# Patient Record
Sex: Male | Born: 1950 | Race: White | Hispanic: No | Smoking: Current every day smoker
Health system: Southern US, Community
[De-identification: ages and names within clinical notes are randomized; demographics above are authoritative.]

## PROBLEM LIST (undated history)

## (undated) DIAGNOSIS — I10 Essential (primary) hypertension: Secondary | ICD-10-CM

## (undated) DIAGNOSIS — I70219 Atherosclerosis of native arteries of extremities with intermittent claudication, unspecified extremity: Secondary | ICD-10-CM

## (undated) DIAGNOSIS — N289 Disorder of kidney and ureter, unspecified: Secondary | ICD-10-CM

## (undated) DIAGNOSIS — I2510 Atherosclerotic heart disease of native coronary artery without angina pectoris: Secondary | ICD-10-CM

## (undated) HISTORY — DX: Essential (primary) hypertension: I10

## (undated) HISTORY — DX: Atherosclerotic heart disease of native coronary artery without angina pectoris: I25.10

## (undated) HISTORY — DX: Atherosclerosis of native arteries of extremities with intermittent claudication, unspecified extremity: I70.219

## (undated) HISTORY — DX: Disorder of kidney and ureter, unspecified: N28.9

---

## 2003-11-26 ENCOUNTER — Inpatient Hospital Stay (HOSPITAL_COMMUNITY): Admission: EM | Admit: 2003-11-26 | Discharge: 2003-11-29 | Payer: Self-pay | Admitting: Cardiology

## 2005-04-08 IMAGING — CR DG CHEST 2V
2 series · 2 of 2 positions shown · non-contrast
Comparison: none

CLINICAL DATA: Acute MI.
 TWO VIEW CHEST ? 11/28/03
 The heart is upper limits of normal in size.  The mediastinal contours are within normal limits.  There is mild peribronchial thickening compatible with bronchitic changes. No focal opacities or effusions. 
 IMPRESSION
 Bronchitic changes.

[view not recorded (1 of 2)]
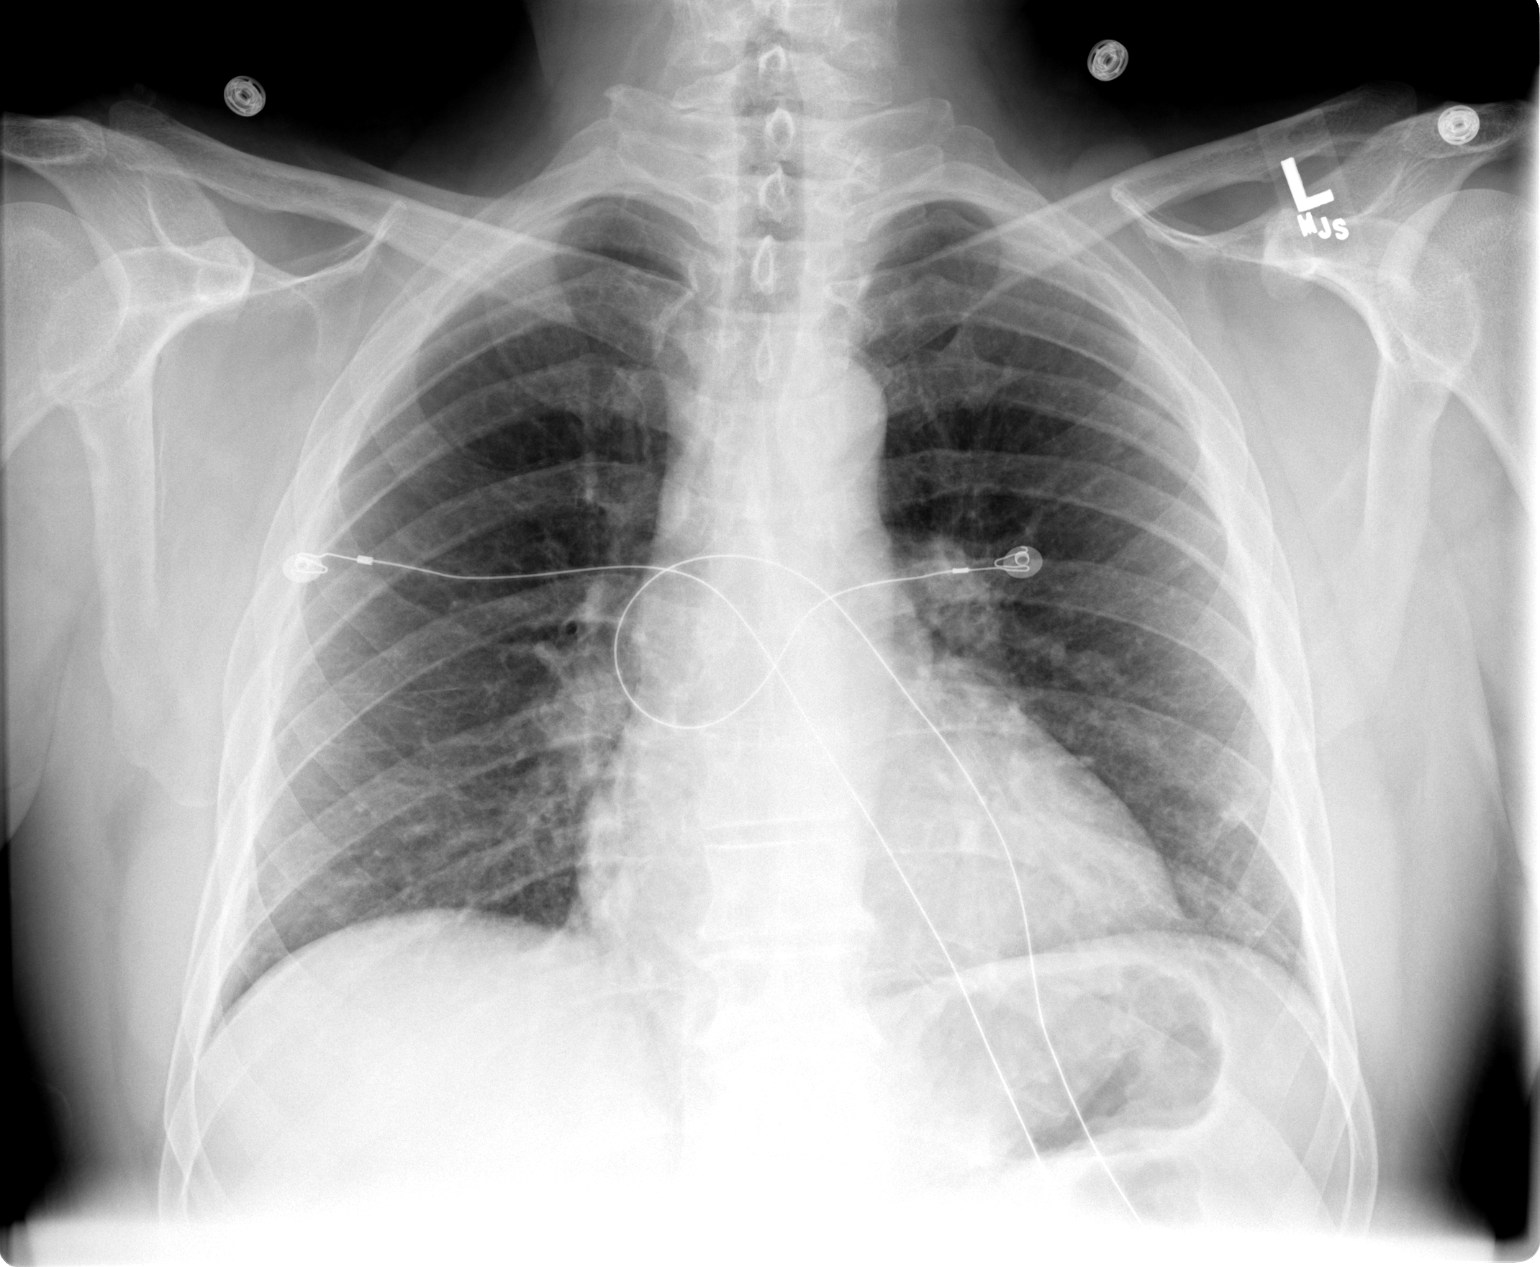

[view not recorded (2 of 2)]
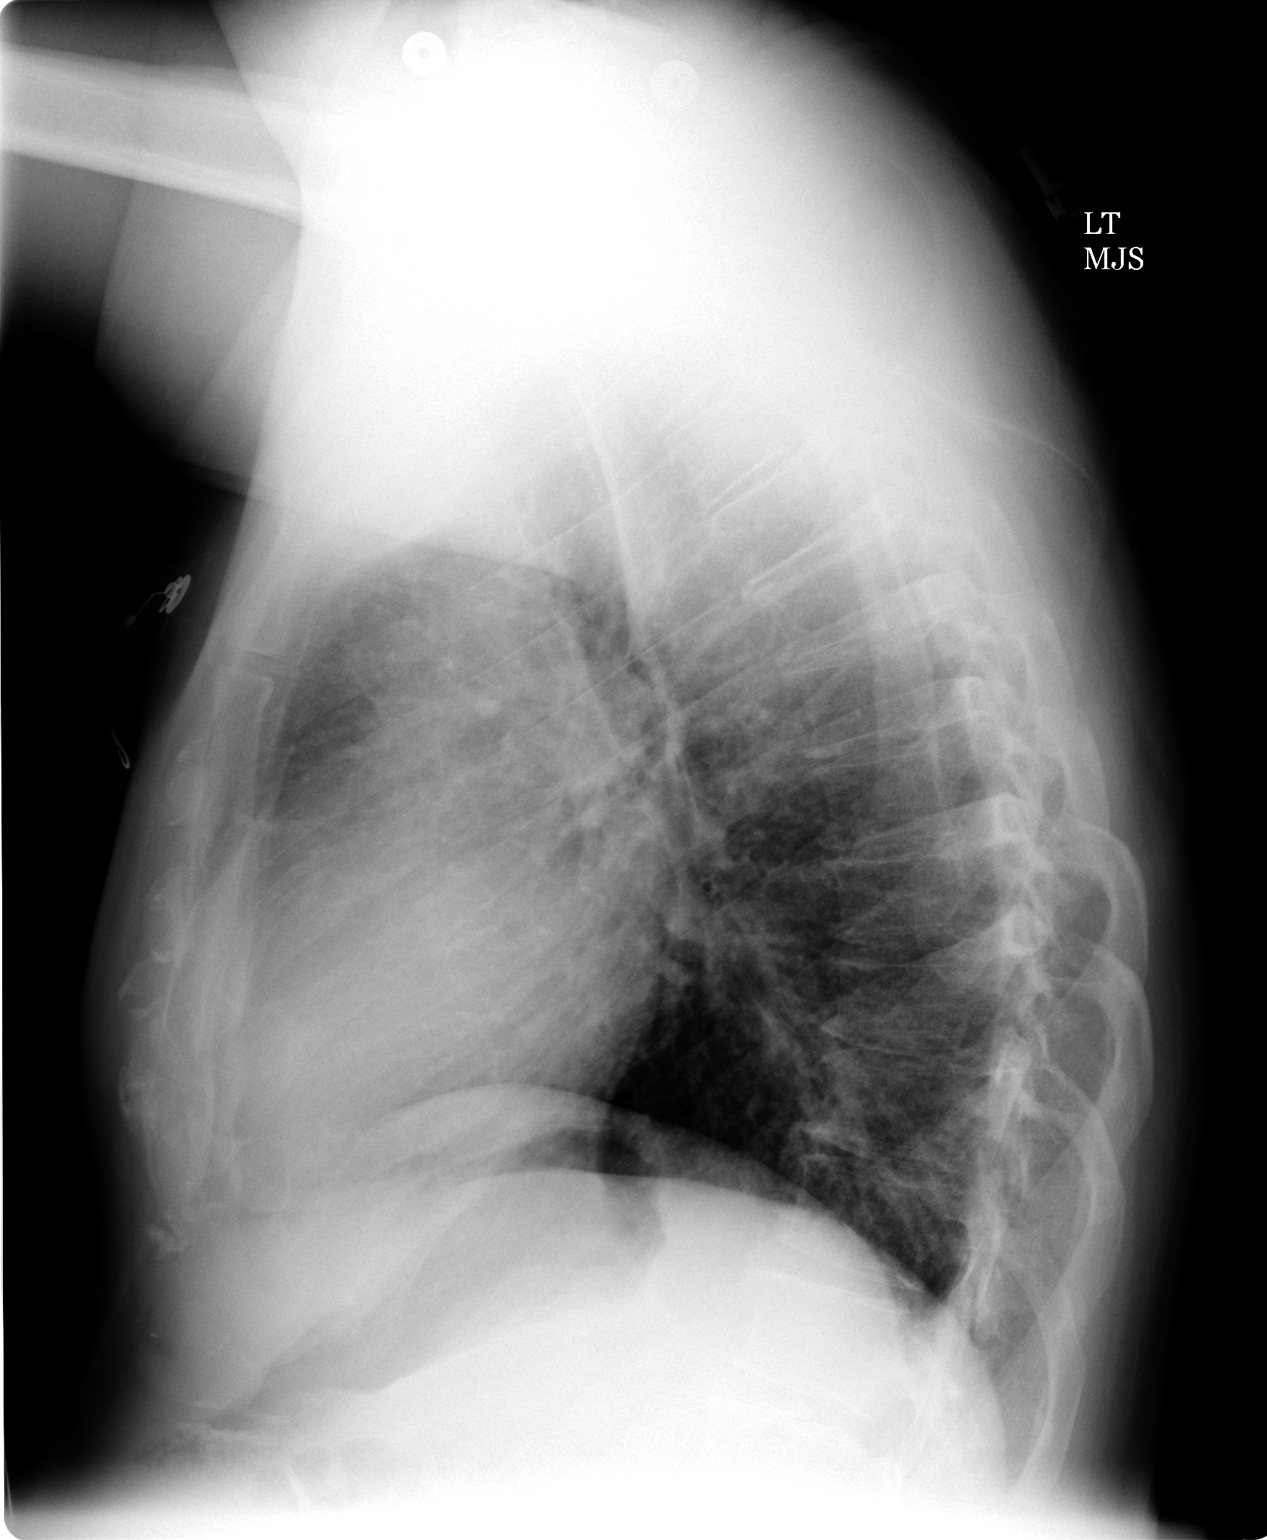

[2 of 2 positions shown; findings below may reference images not displayed]

## 2012-05-18 ENCOUNTER — Other Ambulatory Visit: Payer: Self-pay

## 2012-05-18 DIAGNOSIS — I70219 Atherosclerosis of native arteries of extremities with intermittent claudication, unspecified extremity: Secondary | ICD-10-CM

## 2012-06-20 ENCOUNTER — Encounter: Payer: Self-pay | Admitting: Vascular Surgery

## 2015-10-23 DIAGNOSIS — I739 Peripheral vascular disease, unspecified: Secondary | ICD-10-CM | POA: Diagnosis not present

## 2015-10-23 DIAGNOSIS — I10 Essential (primary) hypertension: Secondary | ICD-10-CM | POA: Diagnosis not present

## 2015-10-23 DIAGNOSIS — I2510 Atherosclerotic heart disease of native coronary artery without angina pectoris: Secondary | ICD-10-CM | POA: Diagnosis not present

## 2015-10-23 DIAGNOSIS — E6609 Other obesity due to excess calories: Secondary | ICD-10-CM | POA: Diagnosis not present

## 2015-10-23 DIAGNOSIS — F17200 Nicotine dependence, unspecified, uncomplicated: Secondary | ICD-10-CM | POA: Diagnosis not present

## 2015-10-23 DIAGNOSIS — E119 Type 2 diabetes mellitus without complications: Secondary | ICD-10-CM | POA: Diagnosis not present

## 2016-01-20 DIAGNOSIS — E782 Mixed hyperlipidemia: Secondary | ICD-10-CM | POA: Diagnosis not present

## 2016-01-20 DIAGNOSIS — F17200 Nicotine dependence, unspecified, uncomplicated: Secondary | ICD-10-CM | POA: Diagnosis not present

## 2016-01-20 DIAGNOSIS — I10 Essential (primary) hypertension: Secondary | ICD-10-CM | POA: Diagnosis not present

## 2016-01-20 DIAGNOSIS — E119 Type 2 diabetes mellitus without complications: Secondary | ICD-10-CM | POA: Diagnosis not present

## 2016-01-20 DIAGNOSIS — N183 Chronic kidney disease, stage 3 (moderate): Secondary | ICD-10-CM | POA: Diagnosis not present

## 2016-03-23 DIAGNOSIS — H25013 Cortical age-related cataract, bilateral: Secondary | ICD-10-CM | POA: Diagnosis not present

## 2016-03-23 DIAGNOSIS — E119 Type 2 diabetes mellitus without complications: Secondary | ICD-10-CM | POA: Diagnosis not present

## 2016-04-21 DIAGNOSIS — I10 Essential (primary) hypertension: Secondary | ICD-10-CM | POA: Diagnosis not present

## 2016-04-21 DIAGNOSIS — F17200 Nicotine dependence, unspecified, uncomplicated: Secondary | ICD-10-CM | POA: Diagnosis not present

## 2016-04-21 DIAGNOSIS — I70213 Atherosclerosis of native arteries of extremities with intermittent claudication, bilateral legs: Secondary | ICD-10-CM | POA: Diagnosis not present

## 2016-04-21 DIAGNOSIS — E119 Type 2 diabetes mellitus without complications: Secondary | ICD-10-CM | POA: Diagnosis not present

## 2016-10-20 DIAGNOSIS — E119 Type 2 diabetes mellitus without complications: Secondary | ICD-10-CM | POA: Diagnosis not present

## 2016-10-20 DIAGNOSIS — N183 Chronic kidney disease, stage 3 (moderate): Secondary | ICD-10-CM | POA: Diagnosis not present

## 2016-10-20 DIAGNOSIS — F17200 Nicotine dependence, unspecified, uncomplicated: Secondary | ICD-10-CM | POA: Diagnosis not present

## 2016-10-20 DIAGNOSIS — I70213 Atherosclerosis of native arteries of extremities with intermittent claudication, bilateral legs: Secondary | ICD-10-CM | POA: Diagnosis not present

## 2016-10-20 DIAGNOSIS — I10 Essential (primary) hypertension: Secondary | ICD-10-CM | POA: Diagnosis not present

## 2017-05-09 DIAGNOSIS — E119 Type 2 diabetes mellitus without complications: Secondary | ICD-10-CM | POA: Diagnosis not present

## 2017-05-09 DIAGNOSIS — I70213 Atherosclerosis of native arteries of extremities with intermittent claudication, bilateral legs: Secondary | ICD-10-CM | POA: Diagnosis not present

## 2017-05-09 DIAGNOSIS — Z720 Tobacco use: Secondary | ICD-10-CM | POA: Diagnosis not present

## 2017-05-09 DIAGNOSIS — I10 Essential (primary) hypertension: Secondary | ICD-10-CM | POA: Diagnosis not present

## 2017-05-09 DIAGNOSIS — N183 Chronic kidney disease, stage 3 (moderate): Secondary | ICD-10-CM | POA: Diagnosis not present

## 2022-07-17 ENCOUNTER — Encounter (HOSPITAL_COMMUNITY): Payer: Self-pay | Admitting: Pharmacy Technician

## 2022-07-17 ENCOUNTER — Emergency Department (HOSPITAL_COMMUNITY): Payer: Medicare Other

## 2022-07-17 ENCOUNTER — Observation Stay (HOSPITAL_COMMUNITY)
Admission: EM | Admit: 2022-07-17 | Discharge: 2022-07-18 | Disposition: A | Discharge status: Home / Self Care | Payer: Medicare Other | Attending: General Surgery | Admitting: General Surgery

## 2022-07-17 DIAGNOSIS — Z79899 Other long term (current) drug therapy: Secondary | ICD-10-CM | POA: Diagnosis not present

## 2022-07-17 DIAGNOSIS — E119 Type 2 diabetes mellitus without complications: Secondary | ICD-10-CM | POA: Diagnosis not present

## 2022-07-17 DIAGNOSIS — S066XAA Traumatic subarachnoid hemorrhage with loss of consciousness status unknown, initial encounter: Secondary | ICD-10-CM

## 2022-07-17 DIAGNOSIS — I2510 Atherosclerotic heart disease of native coronary artery without angina pectoris: Secondary | ICD-10-CM | POA: Diagnosis not present

## 2022-07-17 DIAGNOSIS — T07XXXA Unspecified multiple injuries, initial encounter: Secondary | ICD-10-CM

## 2022-07-17 DIAGNOSIS — Z23 Encounter for immunization: Secondary | ICD-10-CM | POA: Diagnosis not present

## 2022-07-17 DIAGNOSIS — S20319A Abrasion of unspecified front wall of thorax, initial encounter: Secondary | ICD-10-CM | POA: Insufficient documentation

## 2022-07-17 DIAGNOSIS — I10 Essential (primary) hypertension: Secondary | ICD-10-CM | POA: Insufficient documentation

## 2022-07-17 DIAGNOSIS — R590 Localized enlarged lymph nodes: Secondary | ICD-10-CM | POA: Insufficient documentation

## 2022-07-17 DIAGNOSIS — N281 Cyst of kidney, acquired: Secondary | ICD-10-CM | POA: Diagnosis not present

## 2022-07-17 DIAGNOSIS — K5730 Diverticulosis of large intestine without perforation or abscess without bleeding: Secondary | ICD-10-CM | POA: Insufficient documentation

## 2022-07-17 DIAGNOSIS — S40819A Abrasion of unspecified upper arm, initial encounter: Secondary | ICD-10-CM | POA: Diagnosis not present

## 2022-07-17 DIAGNOSIS — I609 Nontraumatic subarachnoid hemorrhage, unspecified: Secondary | ICD-10-CM

## 2022-07-17 DIAGNOSIS — S40212A Abrasion of left shoulder, initial encounter: Secondary | ICD-10-CM | POA: Insufficient documentation

## 2022-07-17 DIAGNOSIS — F17210 Nicotine dependence, cigarettes, uncomplicated: Secondary | ICD-10-CM | POA: Insufficient documentation

## 2022-07-17 DIAGNOSIS — S01112A Laceration without foreign body of left eyelid and periocular area, initial encounter: Secondary | ICD-10-CM | POA: Diagnosis present

## 2022-07-17 DIAGNOSIS — I700 Atherosclerosis of aorta: Secondary | ICD-10-CM | POA: Insufficient documentation

## 2022-07-17 LAB — CBC
HCT: 37.1 % — ABNORMAL LOW (ref 39.0–52.0)
Hemoglobin: 12.9 g/dL — ABNORMAL LOW (ref 13.0–17.0)
MCH: 31.7 pg (ref 26.0–34.0)
MCHC: 34.8 g/dL (ref 30.0–36.0)
MCV: 91.2 fL (ref 80.0–100.0)
Platelets: 305 10*3/uL (ref 150–400)
RBC: 4.07 MIL/uL — ABNORMAL LOW (ref 4.22–5.81)
RDW: 13.1 % (ref 11.5–15.5)
WBC: 9.9 10*3/uL (ref 4.0–10.5)
nRBC: 0 % (ref 0.0–0.2)

## 2022-07-17 LAB — COMPREHENSIVE METABOLIC PANEL
ALT: 11 U/L (ref 0–44)
AST: 15 U/L (ref 15–41)
Albumin: 3.5 g/dL (ref 3.5–5.0)
Alkaline Phosphatase: 52 U/L (ref 38–126)
Anion gap: 10 (ref 5–15)
BUN: 28 mg/dL — ABNORMAL HIGH (ref 8–23)
CO2: 22 mmol/L (ref 22–32)
Calcium: 8.9 mg/dL (ref 8.9–10.3)
Chloride: 105 mmol/L (ref 98–111)
Creatinine, Ser: 2.47 mg/dL — ABNORMAL HIGH (ref 0.61–1.24)
GFR, Estimated: 27 mL/min — ABNORMAL LOW (ref >60)
Glucose, Bld: 225 mg/dL — ABNORMAL HIGH (ref 70–99)
Potassium: 3.5 mmol/L (ref 3.5–5.1)
Sodium: 137 mmol/L (ref 135–145)
Total Bilirubin: 0.8 mg/dL (ref 0.3–1.2)
Total Protein: 6.5 g/dL (ref 6.5–8.1)

## 2022-07-17 LAB — LACTIC ACID, PLASMA: Lactic Acid, Venous: 1.3 mmol/L (ref 0.5–1.9)

## 2022-07-17 LAB — PROTIME-INR
INR: 1.1 (ref 0.8–1.2)
Prothrombin Time: 13.8 seconds (ref 11.4–15.2)

## 2022-07-17 LAB — RAPID URINE DRUG SCREEN, HOSP PERFORMED
Amphetamines: NOT DETECTED
Barbiturates: NOT DETECTED
Benzodiazepines: NOT DETECTED
Cocaine: NOT DETECTED
Opiates: NOT DETECTED
Tetrahydrocannabinol: NOT DETECTED

## 2022-07-17 LAB — URINALYSIS, ROUTINE W REFLEX MICROSCOPIC
Bacteria, UA: NONE SEEN
Bilirubin Urine: NEGATIVE
Glucose, UA: 50 mg/dL — AB
Ketones, ur: NEGATIVE mg/dL
Leukocytes,Ua: NEGATIVE
Nitrite: NEGATIVE
Protein, ur: NEGATIVE mg/dL
Specific Gravity, Urine: 1.026 (ref 1.005–1.030)
pH: 6 (ref 5.0–8.0)

## 2022-07-17 LAB — SAMPLE TO BLOOD BANK

## 2022-07-17 LAB — ETHANOL: Alcohol, Ethyl (B): <10 mg/dL (ref <10)

## 2022-07-17 MED ORDER — LIDOCAINE-EPINEPHRINE (PF) 2 %-1:200000 IJ SOLN
20.0000 mL | Freq: Once | INTRAMUSCULAR | Status: AC
  Administered 2022-07-17: 20 mL

## 2022-07-17 MED ORDER — IOHEXOL 350 MG/ML SOLN
100.0000 mL | Freq: Once | INTRAVENOUS | Status: AC | PRN
  Administered 2022-07-17: 100 mL via INTRAVENOUS

## 2022-07-17 MED ORDER — ONDANSETRON HCL 4 MG/2ML IJ SOLN: 4.0000 mg | Freq: Four times a day (QID) | INTRAMUSCULAR | Status: DC | PRN

## 2022-07-17 MED ORDER — LEVETIRACETAM IN NACL 500 MG/100ML IV SOLN
500.0000 mg | Freq: Two times a day (BID) | INTRAVENOUS | Status: DC
  Administered 2022-07-17 – 2022-07-18 (×2): 500 mg via INTRAVENOUS
  Filled 2022-07-17 (×2): qty 100

## 2022-07-17 MED ORDER — ONDANSETRON 4 MG PO TBDP: 4.0000 mg | ORAL_TABLET | Freq: Four times a day (QID) | ORAL | Status: DC | PRN

## 2022-07-17 MED ORDER — TETANUS-DIPHTH-ACELL PERTUSSIS 5-2.5-18.5 LF-MCG/0.5 IM SUSY
0.5000 mL | PREFILLED_SYRINGE | Freq: Once | INTRAMUSCULAR | Status: AC
  Administered 2022-07-17: 0.5 mL via INTRAMUSCULAR
  Filled 2022-07-17: qty 0.5

## 2022-07-17 MED ORDER — ACETAMINOPHEN 325 MG PO TABS: 650.0000 mg | ORAL_TABLET | ORAL | Status: DC | PRN

## 2022-07-17 MED ORDER — LACTATED RINGERS IV BOLUS
1000.0000 mL | Freq: Once | INTRAVENOUS | Status: AC
  Administered 2022-07-17: 1000 mL via INTRAVENOUS

## 2022-07-17 MED ORDER — METOPROLOL TARTRATE 5 MG/5ML IV SOLN: 5.0000 mg | Freq: Four times a day (QID) | INTRAVENOUS | Status: DC | PRN

## 2022-07-17 MED ORDER — MORPHINE SULFATE (PF) 2 MG/ML IV SOLN: 2.0000 mg | INTRAVENOUS | Status: DC | PRN

## 2022-07-17 MED ORDER — OXYCODONE HCL 5 MG PO TABS: 5.0000 mg | ORAL_TABLET | ORAL | Status: DC | PRN
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
## 2022-07-18 ENCOUNTER — Other Ambulatory Visit: Payer: Self-pay

## 2022-07-18 ENCOUNTER — Observation Stay (HOSPITAL_COMMUNITY): Payer: Medicare Other

## 2022-07-18 ENCOUNTER — Encounter (HOSPITAL_COMMUNITY): Payer: Self-pay

## 2022-07-18 LAB — CBC
HCT: 37.4 % — ABNORMAL LOW (ref 39.0–52.0)
Hemoglobin: 12.5 g/dL — ABNORMAL LOW (ref 13.0–17.0)
MCH: 30.6 pg (ref 26.0–34.0)
MCHC: 33.4 g/dL (ref 30.0–36.0)
MCV: 91.4 fL (ref 80.0–100.0)
Platelets: 303 10*3/uL (ref 150–400)
RBC: 4.09 MIL/uL — ABNORMAL LOW (ref 4.22–5.81)
RDW: 13 % (ref 11.5–15.5)
WBC: 10.6 10*3/uL — ABNORMAL HIGH (ref 4.0–10.5)
nRBC: 0 % (ref 0.0–0.2)

## 2022-07-18 LAB — BASIC METABOLIC PANEL
Anion gap: 8 (ref 5–15)
BUN: 21 mg/dL (ref 8–23)
CO2: 22 mmol/L (ref 22–32)
Calcium: 8.9 mg/dL (ref 8.9–10.3)
Chloride: 109 mmol/L (ref 98–111)
Creatinine, Ser: 1.91 mg/dL — ABNORMAL HIGH (ref 0.61–1.24)
GFR, Estimated: 37 mL/min — ABNORMAL LOW (ref >60)
Glucose, Bld: 117 mg/dL — ABNORMAL HIGH (ref 70–99)
Potassium: 3.8 mmol/L (ref 3.5–5.1)
Sodium: 139 mmol/L (ref 135–145)

## 2022-07-18 LAB — CK: Total CK: 63 U/L (ref 49–397)

## 2022-07-18 MED ORDER — OXYCODONE HCL 5 MG PO TABS: 5.0000 mg | ORAL_TABLET | Freq: Four times a day (QID) | ORAL | 0 refills | Status: AC | PRN

## 2022-07-18 MED ORDER — LEVETIRACETAM 500 MG PO TABS: 500.0000 mg | ORAL_TABLET | Freq: Two times a day (BID) | ORAL | 0 refills | Status: AC

## 2022-07-18 MED ORDER — METOPROLOL TARTRATE 25 MG PO TABS
100.0000 mg | ORAL_TABLET | Freq: Two times a day (BID) | ORAL | Status: DC
  Administered 2022-07-18: 100 mg via ORAL
  Filled 2022-07-18: qty 4

## 2022-07-18 MED ORDER — CLOPIDOGREL BISULFATE 75 MG PO TABS: 75.0000 mg | ORAL_TABLET | Freq: Every day | ORAL | Status: AC

## 2022-07-18 MED ORDER — GLIPIZIDE 10 MG PO TABS
10.0000 mg | ORAL_TABLET | Freq: Two times a day (BID) | ORAL | Status: DC
  Filled 2022-07-18: qty 1

## 2022-07-18 MED ORDER — ACETAMINOPHEN 325 MG PO TABS: 650.0000 mg | ORAL_TABLET | ORAL | Status: AC | PRN

## 2022-07-18 MED ORDER — AMLODIPINE BESYLATE 5 MG PO TABS
10.0000 mg | ORAL_TABLET | Freq: Every day | ORAL | Status: DC
  Administered 2022-07-18: 10 mg via ORAL
  Filled 2022-07-18: qty 2

## 2022-07-18 NOTE — Progress Notes (Signed)
Transition of Care [facility]) - CAGE-AID Screening   Patient Details  Name: Brian Bautista MRN: [id number] Date of Birth: 07-Aug-1951   Elvina Sidle, RN Trauma Response Nurse Phone Number: [phone] 07/18/2022, 1:46 PM    CAGE-AID Screening:    Have You Ever Felt You Ought to Cut Down on Your Drinking or Drug Use?: No Have People Annoyed You By Critizing Your Drinking Or Drug Use?: No Have You Felt Bad Or Guilty About Your Drinking Or Drug Use?: No Have You Ever Had a Drink or Used Drugs First Thing In The Morning to Steady Your Nerves or to Get Rid of a Hangover?: No CAGE-AID Score: 0  Substance Abuse Education Offered: No (pt does not drink alcohol-- does smoke cigarettes--)
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
## 2022-07-18 NOTE — ED Notes (Signed)
RN discussed medications and follow up care with the patients brother. Brother verbalized understanding.
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
## 2022-07-18 NOTE — ED Notes (Signed)
Brother updated.
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
## 2022-07-18 NOTE — ED Notes (Addendum)
RN found a box containing patients hearing aid accessories. RN called Manuela Schwartz and let her know we have them in the ED whenever he wants to pick them up. She stated he has multiple of those boxes and she doesn't think he needs it. RN let her know if they change there mind we will keep them in the ER for a couple days.
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
## 2022-07-18 NOTE — ED Notes (Signed)
Patient refused to let RN change his wet underwear.
